# Patient Record
Sex: Female | Born: 1940 | Race: Black or African American | Hispanic: No | Marital: Single | Smoking: Never smoker
Health system: Southern US, Community
[De-identification: ages and names within clinical notes are randomized; demographics above are authoritative.]

---

## 2014-04-10 ENCOUNTER — Encounter (HOSPITAL_COMMUNITY): Payer: Self-pay

## 2014-04-10 ENCOUNTER — Emergency Department (HOSPITAL_COMMUNITY): Payer: Medicare PPO

## 2014-04-10 ENCOUNTER — Inpatient Hospital Stay (HOSPITAL_COMMUNITY)
Admission: EM | Admit: 2014-04-10 | Discharge: 2014-04-20 | DRG: 064 | Disposition: E | Payer: Medicare PPO | Attending: Internal Medicine | Admitting: Internal Medicine

## 2014-04-10 DIAGNOSIS — Z7982 Long term (current) use of aspirin: Secondary | ICD-10-CM

## 2014-04-10 DIAGNOSIS — Z66 Do not resuscitate: Secondary | ICD-10-CM

## 2014-04-10 DIAGNOSIS — R4189 Other symptoms and signs involving cognitive functions and awareness: Secondary | ICD-10-CM

## 2014-04-10 DIAGNOSIS — I629 Nontraumatic intracranial hemorrhage, unspecified: Secondary | ICD-10-CM

## 2014-04-10 DIAGNOSIS — G911 Obstructive hydrocephalus: Secondary | ICD-10-CM | POA: Diagnosis present

## 2014-04-10 DIAGNOSIS — W19XXXA Unspecified fall, initial encounter: Secondary | ICD-10-CM

## 2014-04-10 DIAGNOSIS — I619 Nontraumatic intracerebral hemorrhage, unspecified: Secondary | ICD-10-CM | POA: Diagnosis present

## 2014-04-10 DIAGNOSIS — R4020 Unspecified coma: Secondary | ICD-10-CM | POA: Diagnosis present

## 2014-04-10 DIAGNOSIS — G935 Compression of brain: Secondary | ICD-10-CM | POA: Diagnosis present

## 2014-04-10 DIAGNOSIS — R40243 Glasgow coma scale score 3-8: Secondary | ICD-10-CM | POA: Diagnosis present

## 2014-04-10 DIAGNOSIS — J9602 Acute respiratory failure with hypercapnia: Secondary | ICD-10-CM

## 2014-04-10 DIAGNOSIS — J9600 Acute respiratory failure, unspecified whether with hypoxia or hypercapnia: Secondary | ICD-10-CM

## 2014-04-10 DIAGNOSIS — R4182 Altered mental status, unspecified: Secondary | ICD-10-CM | POA: Diagnosis present

## 2014-04-10 DIAGNOSIS — I612 Nontraumatic intracerebral hemorrhage in hemisphere, unspecified: Secondary | ICD-10-CM

## 2014-04-10 DIAGNOSIS — Z515 Encounter for palliative care: Secondary | ICD-10-CM

## 2014-04-10 DIAGNOSIS — J9601 Acute respiratory failure with hypoxia: Secondary | ICD-10-CM | POA: Diagnosis present

## 2014-04-10 DIAGNOSIS — I959 Hypotension, unspecified: Secondary | ICD-10-CM | POA: Diagnosis present

## 2014-04-10 DIAGNOSIS — Z01818 Encounter for other preprocedural examination: Secondary | ICD-10-CM

## 2014-04-10 LAB — I-STAT CHEM 8, ED
BUN: 22 mg/dL (ref 6–23)
CALCIUM ION: 1.1 mmol/L — AB (ref 1.13–1.30)
CHLORIDE: 100 mmol/L (ref 96–112)
CREATININE: 1 mg/dL (ref 0.50–1.10)
Glucose, Bld: 174 mg/dL — ABNORMAL HIGH (ref 70–99)
HEMATOCRIT: 41 % (ref 36.0–46.0)
Hemoglobin: 13.9 g/dL (ref 12.0–15.0)
POTASSIUM: 3.1 mmol/L — AB (ref 3.5–5.1)
Sodium: 140 mmol/L (ref 135–145)
TCO2: 25 mmol/L (ref 0–100)

## 2014-04-10 LAB — CBC WITH DIFFERENTIAL/PLATELET
BASOS ABS: 0.1 10*3/uL (ref 0.0–0.1)
Basophils Relative: 0 % (ref 0–1)
EOS ABS: 0.4 10*3/uL (ref 0.0–0.7)
Eosinophils Relative: 3 % (ref 0–5)
HEMATOCRIT: 37.2 % (ref 36.0–46.0)
HEMOGLOBIN: 12.4 g/dL (ref 12.0–15.0)
LYMPHS ABS: 3.6 10*3/uL (ref 0.7–4.0)
LYMPHS PCT: 25 % (ref 12–46)
MCH: 32.1 pg (ref 26.0–34.0)
MCHC: 33.3 g/dL (ref 30.0–36.0)
MCV: 96.4 fL (ref 78.0–100.0)
MONO ABS: 0.7 10*3/uL (ref 0.1–1.0)
MONOS PCT: 5 % (ref 3–12)
Neutro Abs: 9.9 10*3/uL — ABNORMAL HIGH (ref 1.7–7.7)
Neutrophils Relative %: 67 % (ref 43–77)
Platelets: 249 10*3/uL (ref 150–400)
RBC: 3.86 MIL/uL — ABNORMAL LOW (ref 3.87–5.11)
RDW: 13.3 % (ref 11.5–15.5)
WBC: 14.6 10*3/uL — AB (ref 4.0–10.5)

## 2014-04-10 LAB — COMPREHENSIVE METABOLIC PANEL
ALT: 15 U/L (ref 0–35)
AST: 23 U/L (ref 0–37)
Albumin: 3.9 g/dL (ref 3.5–5.2)
Alkaline Phosphatase: 72 U/L (ref 39–117)
Anion gap: 11 (ref 5–15)
BILIRUBIN TOTAL: 0.3 mg/dL (ref 0.3–1.2)
BUN: 19 mg/dL (ref 6–23)
CHLORIDE: 100 mmol/L (ref 96–112)
CO2: 27 mmol/L (ref 19–32)
Calcium: 8.9 mg/dL (ref 8.4–10.5)
Creatinine, Ser: 1.09 mg/dL (ref 0.50–1.10)
GFR calc non Af Amer: 49 mL/min — ABNORMAL LOW (ref >90)
GFR, EST AFRICAN AMERICAN: 57 mL/min — AB (ref >90)
Glucose, Bld: 170 mg/dL — ABNORMAL HIGH (ref 70–99)
Potassium: 3.1 mmol/L — ABNORMAL LOW (ref 3.5–5.1)
Sodium: 138 mmol/L (ref 135–145)
Total Protein: 7.6 g/dL (ref 6.0–8.3)

## 2014-04-10 LAB — I-STAT VENOUS BLOOD GAS, ED
Acid-base deficit: 1 mmol/L (ref 0.0–2.0)
Bicarbonate: 25 mEq/L — ABNORMAL HIGH (ref 20.0–24.0)
O2 Saturation: 99 %
TCO2: 26 mmol/L (ref 0–100)
pCO2, Ven: 46.5 mmHg (ref 45.0–50.0)
pH, Ven: 7.338 — ABNORMAL HIGH (ref 7.250–7.300)
pO2, Ven: 146 mmHg — ABNORMAL HIGH (ref 30.0–45.0)

## 2014-04-10 LAB — RAPID URINE DRUG SCREEN, HOSP PERFORMED
Amphetamines: NOT DETECTED
Barbiturates: NOT DETECTED
Benzodiazepines: NOT DETECTED
Cocaine: NOT DETECTED
OPIATES: POSITIVE — AB
Tetrahydrocannabinol: NOT DETECTED

## 2014-04-10 LAB — URINALYSIS, ROUTINE W REFLEX MICROSCOPIC
Bilirubin Urine: NEGATIVE
GLUCOSE, UA: 100 mg/dL — AB
Hgb urine dipstick: NEGATIVE
KETONES UR: NEGATIVE mg/dL
LEUKOCYTES UA: NEGATIVE
Nitrite: NEGATIVE
Protein, ur: NEGATIVE mg/dL
Specific Gravity, Urine: 1.015 (ref 1.005–1.030)
Urobilinogen, UA: 0.2 mg/dL (ref 0.0–1.0)
pH: 6 (ref 5.0–8.0)

## 2014-04-10 LAB — AMMONIA: Ammonia: 27 umol/L (ref 11–32)

## 2014-04-10 LAB — I-STAT CG4 LACTIC ACID, ED: LACTIC ACID, VENOUS: 2.04 mmol/L — AB (ref 0.5–2.0)

## 2014-04-10 MED ORDER — SODIUM CHLORIDE 0.9 % IV SOLN
INTRAVENOUS | Status: AC | PRN
  Administered 2014-04-10: 1000 mL via INTRAVENOUS

## 2014-04-10 MED ORDER — SUCCINYLCHOLINE CHLORIDE 20 MG/ML IJ SOLN
INTRAMUSCULAR | Status: AC | PRN
Start: 2014-04-10 — End: 2014-04-10
  Administered 2014-04-10: 120 mg via INTRAVENOUS

## 2014-04-10 MED ORDER — NICARDIPINE HCL IN NACL 20-0.86 MG/200ML-% IV SOLN
3.0000 mg/h | Freq: Once | INTRAVENOUS | Status: DC
  Filled 2014-04-10: qty 200

## 2014-04-10 MED ORDER — PROPOFOL 10 MG/ML IV EMUL
INTRAVENOUS | Status: DC | PRN
  Administered 2014-04-10: 5 ug/kg/min via INTRAVENOUS

## 2014-04-10 MED ORDER — PROPOFOL 10 MG/ML IV EMUL: 5.0000 ug/kg/min | Freq: Once | INTRAVENOUS | Status: AC

## 2014-04-10 MED ORDER — ETOMIDATE 2 MG/ML IV SOLN
INTRAVENOUS | Status: AC | PRN
  Administered 2014-04-10: 20 mg via INTRAVENOUS

## 2014-04-10 MED ORDER — LIDOCAINE HCL (CARDIAC) 20 MG/ML IV SOLN
INTRAVENOUS | Status: AC | PRN
  Administered 2014-04-10: 100 mg via INTRAVENOUS

## 2014-04-10 MED ORDER — CETYLPYRIDINIUM CHLORIDE 0.05 % MT LIQD
7.0000 mL | Freq: Four times a day (QID) | OROMUCOSAL | Status: DC
  Administered 2014-04-10 – 2014-04-12 (×6): 7 mL via OROMUCOSAL

## 2014-04-10 MED ORDER — CHLORHEXIDINE GLUCONATE 0.12 % MT SOLN
15.0000 mL | Freq: Two times a day (BID) | OROMUCOSAL | Status: DC
  Administered 2014-04-11 (×3): 15 mL via OROMUCOSAL
  Filled 2014-04-10 (×3): qty 15

## 2014-04-10 MED ORDER — SODIUM CHLORIDE 0.9 % IV SOLN
INTRAVENOUS | Status: DC
  Administered 2014-04-10: 22:00:00 via INTRAVENOUS

## 2014-04-10 MED ORDER — SODIUM CHLORIDE 0.9 % IV SOLN: 250.0000 mL | INTRAVENOUS | Status: DC | PRN

## 2014-04-10 MED ORDER — PHENYLEPHRINE HCL 10 MG/ML IJ SOLN
30.0000 ug/min | INTRAVENOUS | Status: DC
  Administered 2014-04-10: 50 ug/min via INTRAVENOUS
  Filled 2014-04-10: qty 1

## 2014-04-10 MED ORDER — SODIUM CHLORIDE 0.9 % IV BOLUS (SEPSIS)
500.0000 mL | Freq: Once | INTRAVENOUS | Status: AC
  Administered 2014-04-10: 500 mL via INTRAVENOUS

## 2014-04-10 NOTE — ED Notes (Signed)
Please page Dr. Franky Machoabbell at [phone] when the pt's other daughter arrives.

## 2014-04-10 NOTE — ED Notes (Signed)
DR. Romeo AppleHarrison at bedside to speak with family about DNR orders.

## 2014-04-10 NOTE — ED Provider Notes (Signed)
5:55 PM Accepted care from Dr. Craige CottaSchmitt. Dr. Mikal Planeabell has seen the pt as well and agrees it is a non-survivable head bleed. Currently awaiting more family to get here to discuss possible extubation.   The family would like the patient admitted until further family members get here. I discussed the case with critical care who will admit her. I filled out DO NOT RESUSCITATE paperwork with the family's permission.  Clinical Impression 1. Intracranial hemorrhage   2. Unresponsive   3. Encounter for intubation   4. Shirley Buchanan      Unknown Flannigan, MD 04/14/2014 [id number]

## 2014-04-10 NOTE — ED Notes (Signed)
Pt found unresponsive between toilet and shower last seen at 1300 today, pt did not respond to painful stimuli, after IN narcan and IV narcan only change was increase in respirations. Pt cbg with ems 142 hypertensive 200/170

## 2014-04-10 NOTE — ED Provider Notes (Signed)
CSN: [id number]     Arrival date & time 03/27/2014  1340 History   First MD Initiated Contact with Patient 03/24/2014 1352     Chief Complaint  Patient presents with  . Altered Mental Status     (Consider location/radiation/quality/duration/timing/severity/associated sxs/prior Treatment) HPI Comments: Level 5 exception for acuity.   Patient is a 74 y.o. female presenting with altered mental status. The history is provided by the EMS personnel. The history is limited by the condition of the patient. No language interpreter was used.  Altered Mental Status Presenting symptoms: unresponsiveness   Severity:  Severe Most recent episode:  Today Context: not head injury     History reviewed. No pertinent past medical history. History reviewed. No pertinent past surgical history. History reviewed. No pertinent family history. History  Substance Use Topics  . Smoking status: Never Smoker   . Smokeless tobacco: Not on file  . Alcohol Use: No   OB History    No data available     Review of Systems  Unable to perform ROS: Patient unresponsive      Allergies  Review of patient's allergies indicates no known allergies.  Home Medications   Prior to Admission medications   Medication Sig Start Date End Date Taking? Authorizing Provider  aspirin 81 MG tablet Take 81 mg by mouth daily.   Yes Historical Provider, MD  benzonatate (TESSALON) 100 MG capsule Take 100 mg by mouth 3 (three) times daily as needed for cough.   Yes Historical Provider, MD  dextromethorphan-guaiFENesin (MUCINEX DM) 30-600 MG per 12 hr tablet Take 1 tablet by mouth 2 (two) times daily as needed for cough.   Yes Historical Provider, MD  Diclofenac Sodium 2 % SOLN Place onto the skin.   Yes Historical Provider, MD  loratadine (CLARITIN) 10 MG tablet Take 10 mg by mouth daily.   Yes Historical Provider, MD  Naproxen-Esomeprazole 500-20 MG TBEC Take by mouth.   Yes Historical Provider, MD  omeprazole (PRILOSEC) 20 MG  capsule Take 20 mg by mouth 2 (two) times daily.   Yes Historical Provider, MD  promethazine-codeine (PHENERGAN WITH CODEINE) 6.25-10 MG/5ML syrup Take 5 mLs by mouth every 6 (six) hours as needed for cough.   Yes Historical Provider, MD  triamterene-hydrochlorothiazide (DYAZIDE) 37.5-25 MG per capsule Take 1 capsule by mouth daily.   Yes Historical Provider, MD   BP 101/49 mmHg  Pulse 57  Temp(Src) 93.9 F (34.4 C) (Rectal)  Resp 16  Wt 200 lb (90.719 kg)  SpO2 100% Physical Exam  Constitutional: She appears well-developed and well-nourished.  HENT:  Head: Normocephalic and atraumatic.  Eyes: Right pupil is not reactive. Right pupil is round. Left pupil is not reactive. Left pupil is round. Pupils are equal.  Pinpoint pupils  Cardiovascular: Normal rate, regular rhythm, normal heart sounds and intact distal pulses.   Pulmonary/Chest: Effort normal. No respiratory distress. She has no wheezes. She exhibits no tenderness.  Abdominal: Soft. Bowel sounds are normal. She exhibits no distension. There is no tenderness. There is no rebound and no guarding.  Neurological: She is unresponsive. GCS eye subscore is 1. GCS verbal subscore is 1. GCS motor subscore is 1.  No spontaneous movement and no response to pain.    Skin: Skin is warm and dry.  Nursing note and vitals reviewed.   ED Course  INTUBATION Date/Time: 03/27/2014 2:00 PM Performed by: Bethann Berkshire Authorized by: Bethann Berkshire Consent: The procedure was performed in an emergent situation. Indications: airway protection Intubation  method: direct Patient status: paralyzed (RSI) Preoxygenation: BVM Sedatives: etomidate Paralytic: succinylcholine Laryngoscope size: Miller 3 Tube size: 7.5 mm Tube type: cuffed Number of attempts: 1 Cricoid pressure: yes Cords visualized: yes Post-procedure assessment: chest rise and CO2 detector Breath sounds: equal Cuff inflated: yes ETT to lip: 21 cm Tube secured with: ETT  holder Chest x-ray interpreted by me. Chest x-ray findings: endotracheal tube in appropriate position Patient tolerance: Patient tolerated the procedure well with no immediate complications   (including critical care time) Labs Review Labs Reviewed  CBC WITH DIFFERENTIAL - Abnormal; Notable for the following:    WBC 14.6 (*)    RBC 3.86 (*)    Neutro Abs 9.9 (*)    All other components within normal limits  URINE RAPID DRUG SCREEN (HOSP PERFORMED) - Abnormal; Notable for the following:    Opiates POSITIVE (*)    All other components within normal limits  URINALYSIS, ROUTINE W REFLEX MICROSCOPIC - Abnormal; Notable for the following:    Glucose, UA 100 (*)    All other components within normal limits  COMPREHENSIVE METABOLIC PANEL - Abnormal; Notable for the following:    Potassium 3.1 (*)    Glucose, Bld 170 (*)    GFR calc non Af Amer 49 (*)    GFR calc Af Amer 57 (*)    All other components within normal limits  I-STAT CHEM 8, ED - Abnormal; Notable for the following:    Potassium 3.1 (*)    Glucose, Bld 174 (*)    Calcium, Ion 1.10 (*)    All other components within normal limits  I-STAT CG4 LACTIC ACID, ED - Abnormal; Notable for the following:    Lactic Acid, Venous 2.04 (*)    All other components within normal limits  I-STAT VENOUS BLOOD GAS, ED - Abnormal; Notable for the following:    pH, Ven 7.338 (*)    pO2, Ven 146.0 (*)    Bicarbonate 25.0 (*)    All other components within normal limits  MRSA PCR SCREENING  AMMONIA    Imaging Review Ct Head Wo Contrast  03/25/2014   CLINICAL DATA:  Found down.  EXAM: CT HEAD WITHOUT CONTRAST  CT CERVICAL SPINE WITHOUT CONTRAST  TECHNIQUE: Multidetector CT imaging of the head and cervical spine was performed following the standard protocol without intravenous contrast. Multiplanar CT image reconstructions of the cervical spine were also generated.  COMPARISON:  None.  FINDINGS: CT HEAD FINDINGS  There is a large amount of  intraventricular hemorrhage which appears primarily centered at the level of the fourth ventricle which appears markedly expanded (image 11, series 2). There is upstream hydrocephalus with blood seen within the third and bilateral lateral ventricles. Subarachnoid blood is seen about the imaged superior aspect of the cervical spine (image 4, series 2).  No definite evidence of subdural hemorrhage. No definite large territory infarct. No definite intraparenchymal or extra-axial mass. No midline shift. Intracranial atherosclerosis.  Mucosal thickening within the bilateral posterior ethmoidal air cells. Small air-fluid level within the right maxillary sinus. Remaining paranasal sinuses mastoid air cells are normally aerated.  Regional soft tissues appear normal. No displaced calvarial fracture.  CT CERVICAL SPINE FINDINGS  C1 to the inferior endplate of T2 is imaged.  There is mild straightening and slight reversal of the expected cervical lordosis. No anterolisthesis or retrolisthesis. The bilateral facets are normally aligned. The dens is normally positioned between the lateral masses of C1.  No fracture or static subluxation of the cervical  spine. Cervical vertebral body heights and prevertebral soft tissues are normal.  The patient is intubated.  An orogastric tube is also identified.  Moderate to severe multilevel cervical spine DDD, worse at C5-C6 and to a lesser extent, C3-C4, C4-C5 and C6-C7 with disc space height loss, endplate irregularity and sclerosis. A bone island is incidentally noted within the right C7 pedicle.  No bulky cervical lymphadenopathy on this noncontrast examination.  Limited visualization of lung apices demonstrates biapical pleural parenchymal thickening and suspected small bilateral effusions, left greater than right.  IMPRESSION: 1. Large amount of intra ventricular blood which appears centered within the fourth ventricle, the etiology of which is not depicted on this examination. 2.  Intraventricular blood is seen extending through the third ventricle to the bilateral lateral ventricles where mild to moderate upstream hydrocephalus is identified. 3. A small amount of subarachnoid blood surrounds the image superior aspect of the cervical spine. 4. No fracture or static subluxation of the cervical spine. 5. Moderate to severe multilevel cervical spine DDD, worse at C5-C6. Critical Value/emergent results were called by telephone at the time of interpretation on 2014-04-16 at 2:43 pm to Dr. Arby Barrette , who verbally acknowledged these results.   Electronically Signed   By: Simonne Come M.D.   On: 04/16/2014 14:42   Ct Cervical Spine Wo Contrast  April 16, 2014   CLINICAL DATA:  Found down.  EXAM: CT HEAD WITHOUT CONTRAST  CT CERVICAL SPINE WITHOUT CONTRAST  TECHNIQUE: Multidetector CT imaging of the head and cervical spine was performed following the standard protocol without intravenous contrast. Multiplanar CT image reconstructions of the cervical spine were also generated.  COMPARISON:  None.  FINDINGS: CT HEAD FINDINGS  There is a large amount of intraventricular hemorrhage which appears primarily centered at the level of the fourth ventricle which appears markedly expanded (image 11, series 2). There is upstream hydrocephalus with blood seen within the third and bilateral lateral ventricles. Subarachnoid blood is seen about the imaged superior aspect of the cervical spine (image 4, series 2).  No definite evidence of subdural hemorrhage. No definite large territory infarct. No definite intraparenchymal or extra-axial mass. No midline shift. Intracranial atherosclerosis.  Mucosal thickening within the bilateral posterior ethmoidal air cells. Small air-fluid level within the right maxillary sinus. Remaining paranasal sinuses mastoid air cells are normally aerated.  Regional soft tissues appear normal. No displaced calvarial fracture.  CT CERVICAL SPINE FINDINGS  C1 to the inferior endplate of T2  is imaged.  There is mild straightening and slight reversal of the expected cervical lordosis. No anterolisthesis or retrolisthesis. The bilateral facets are normally aligned. The dens is normally positioned between the lateral masses of C1.  No fracture or static subluxation of the cervical spine. Cervical vertebral body heights and prevertebral soft tissues are normal.  The patient is intubated.  An orogastric tube is also identified.  Moderate to severe multilevel cervical spine DDD, worse at C5-C6 and to a lesser extent, C3-C4, C4-C5 and C6-C7 with disc space height loss, endplate irregularity and sclerosis. A bone island is incidentally noted within the right C7 pedicle.  No bulky cervical lymphadenopathy on this noncontrast examination.  Limited visualization of lung apices demonstrates biapical pleural parenchymal thickening and suspected small bilateral effusions, left greater than right.  IMPRESSION: 1. Large amount of intra ventricular blood which appears centered within the fourth ventricle, the etiology of which is not depicted on this examination. 2. Intraventricular blood is seen extending through the third ventricle to the bilateral lateral  ventricles where mild to moderate upstream hydrocephalus is identified. 3. A small amount of subarachnoid blood surrounds the image superior aspect of the cervical spine. 4. No fracture or static subluxation of the cervical spine. 5. Moderate to severe multilevel cervical spine DDD, worse at C5-C6. Critical Value/emergent results were called by telephone at the time of interpretation on 03/21/2014 at 2:43 pm to Dr. Arby Barrette , who verbally acknowledged these results.   Electronically Signed   By: Simonne Come M.D.   On: 04/07/2014 14:42   Dg Chest Portable 1 View  04/07/2014   CLINICAL DATA:  Intubation, trauma  EXAM: PORTABLE CHEST - 1 VIEW  COMPARISON:  None.  FINDINGS: Endotracheal tube 2.4 cm above the carina. NG tube enters the stomach with the tip not  visualized. Mild cardiomegaly with central vascular congestion. Minor bibasilar and streaky left midlung atelectasis. No focal pneumonia, collapse or consolidation. No effusion or pneumothorax. Atherosclerosis of the aorta. Degenerative changes of the shoulders.  IMPRESSION: Cardiomegaly with vascular congestion  Bilateral atelectasis.  Support apparatus as above.   Electronically Signed   By: Ruel Favors M.D.   On: 04/01/2014 14:20     EKG Interpretation   Date/Time:  Friday April 10 2014 13:45:26 EST Ventricular Rate:  69 PR Interval:  54 QRS Duration: 99 QT Interval:  417 QTC Calculation: 447 R Axis:   51 Text Interpretation:  Age not entered, assumed to be  74 years old for  purpose of ECG interpretation Sinus rhythm Short PR interval Minimal ST  depression, inferior leads AGREE. NO stemi. Confirmed by Donnald Garre, MD,  Lebron Conners [phone]) on 04/16/2014 4:21:31 PM      MDM   Final diagnoses:  Intracranial hemorrhage    74 y/o F with PMH of HTN here with AMS.  Patient found in the bathroom unresponsive by her daughter.  She was found by EMS to have agonal respirations.  She was bagged en route to the emergency department.  Upon arrival she is hypertensive with pinpoint pupils.  She was treated with narcan by EMS with no change in her mental status.  Patient was felt to require intubation for airway protection and was intubated as detailed above upon arrival.  Initial differential is concerning for intracranial hemorrhage, overdose, traumatic injury, sepsis, and metabolic encephalopathy.  Initial work-up included CT head w/o contrast, CMP, ammonia, CBC, lactic acid, UA, UDS, and VBG.  CT of the head demonstrated a large brainstem head bleed.  Neurosurgery was consulted.  They evaluated the patient's imaging and felt that the patient's head bleed was non-survivable.  Prior to transfer to the CT scanner patient was started on propofol for sedation.  After the CT scan she became hypotensive with a  BP of 60/39.  As a result the propofol was held and she was treated with  2 liters of NS.  Patient did not have any improvement in her blood pressure.  The patient had no change in her mental status after holding the propofol.  She did not have any spontaneous movements.  Patient's prognosis was discussed with her daughter.  The daughter wished to discuss the patient's prognosis with the remainder of her family.  The patient's care was transferred to Dr. Essie Christine in a good condition.  We were awaiting the families decision about further care of the patient.  Labs and imaging were reviewed by myself and considered in medical decision making.  Imaging was interpreted by Radiology.  Care was discussed with my attending Dr. Donnald Garre.  Bethann BerkshireAaron Niurka Benecke, MD 04/11/14 [id number]  Arby BarretteMarcy Pfeiffer, MD 04/11/14 [phone]

## 2014-04-10 NOTE — ED Notes (Signed)
Dr. Franky Machoabbell paged to notify of family arrival

## 2014-04-10 NOTE — ED Notes (Signed)
X ray done

## 2014-04-10 NOTE — ED Notes (Signed)
[facility] called. Spoke with Barbette Orionne Garner. Reference number [id number] [id number].

## 2014-04-10 NOTE — ED Notes (Signed)
Patient transported to CT 

## 2014-04-10 NOTE — ED Notes (Signed)
MD at bedside. 

## 2014-04-10 NOTE — Progress Notes (Signed)
Call from Dr Romeo AppleHarrison in ER  - massive IC Hge - non survivable - no neurosur intervention possible  Plan - admit by PCCM to [facility] - eMD will do admit orders and then do camera care upon arrival to 56M - DNAR to be initiated by ER doc  - family to gather per ER doc so terminal wean can be initiated    Dr. Kalman ShanMurali Tanveer Dobberstein, M.D., [facility] Pulmonary and Critical Care Medicine Staff Physician [facility] [place] [facility] Pager: [phone], If no answer or between  15:00h - 7:00h: call [phone]  04/11/2014 9:26 PM

## 2014-04-10 NOTE — Progress Notes (Signed)
Camera communication with a daughter and son at bedside  - explaiend terminal prognosis and gcs 3 - currently hypotensive with HR 63 and herniating likely  She wants patuient "kept alive" till [facility] [facility] [place] kept alive. ETA of this brother is 04/11/14 > 4pm.   PLAN Start neo Explained herniation and prognosis -she understand and hoping for a miracle patient alive till brother gets in  Dr. Kalman ShanMurali Willie Plain, M.D., [facility] Pulmonary and Critical Care Medicine Staff Physician [facility] [place] [facility] and Critical Care Pager: [phone], If no answer or between  15:00h - 7:00h: call [phone]  Jul 09, 2014 11:16 PM

## 2014-04-10 NOTE — ED Notes (Signed)
Returned from ct, MD aware of results and blood pressure.

## 2014-04-10 NOTE — ED Notes (Signed)
Pt first name has been changed in the system after blood tubes have been labeled. MRN still the same. Called lab to verify this labeling was okay, which it is per lab tech.

## 2014-04-10 NOTE — Progress Notes (Signed)
Chaplain visited with Ms. Shirley Buchanan, her daughter and family friend bedside.   Pt's daughter is wrestling with what's happening to her mother. She has made numerous calls to family members locally and in the surrounding area to come support and be with her.   Daughter contacted pastor as well. Friend and daughter are bedside. Daughter has begun to anticipate grief and is visibly sorrowful.   [name], [name], Chaplain 04/01/2014

## 2014-04-10 NOTE — H&P (Addendum)
PULMONARY / CRITICAL CARE MEDICINE   Name: Shirley Buchanan MRN: [id number] DOB: 11-22-1940    ADMISSION DATE:  03/22/2014 CONSULTATION DATE: 04/07/2014   REFERRING MD :  Dr Romeo AppleHarrison of ER  CHIEF COMPLAINT:  Massive Intracerebral Hemorrhage   HISTORY OF PRESENT ILLNESS:  Hx from ER notes and Dr Romeo Appleharrison. Patient comatose and uanble to provide h x;   74 year old female found unresponisvve in shower 04/16/2014. Last seen normal at 13.00h today. EMS arrival BP 200/170. CT showed massive intracranial hemorrhage - non survivable. Seen by Dr Eulogio Bearabbeell in ER. Family agreed to DNAR in ER and iwshes to proceed with terminal wean after son from [place] arrives 04/11/14 > 4pm. PCCM called to admit. On arrival to 11M ICU - hypotensive MAP 39 with HR 60 and GCS 3 without sedation. Family requesting patient be kept alive till son arrives 04/11/14 to extent possible   PAST MEDICAL HISTORY :   has no past medical history on file.  has no past surgical history on file. Prior to Admission medications   Medication Sig Start Date End Date Taking? Authorizing Provider  aspirin 81 MG tablet Take 81 mg by mouth daily.   Yes Historical Provider, MD  benzonatate (TESSALON) 100 MG capsule Take 100 mg by mouth 3 (three) times daily as needed for cough.   Yes Historical Provider, MD  dextromethorphan-guaiFENesin (MUCINEX DM) 30-600 MG per 12 hr tablet Take 1 tablet by mouth 2 (two) times daily as needed for cough.   Yes Historical Provider, MD  Diclofenac Sodium 2 % SOLN Place onto the skin.   Yes Historical Provider, MD  loratadine (CLARITIN) 10 MG tablet Take 10 mg by mouth daily.   Yes Historical Provider, MD  Naproxen-Esomeprazole 500-20 MG TBEC Take by mouth.   Yes Historical Provider, MD  omeprazole (PRILOSEC) 20 MG capsule Take 20 mg by mouth 2 (two) times daily.   Yes Historical Provider, MD  promethazine-codeine (PHENERGAN WITH CODEINE) 6.25-10 MG/5ML syrup Take 5 mLs by mouth every 6 (six) hours as needed for  cough.   Yes Historical Provider, MD  triamterene-hydrochlorothiazide (DYAZIDE) 37.5-25 MG per capsule Take 1 capsule by mouth daily.   Yes Historical Provider, MD   No Known Allergies  FAMILY HISTORY:  has no family status information on file.  SOCIAL HISTORY:  reports that she does not drink alcohol or use illicit drugs.  REVIEW OF SYSTEMS:  unelicitable  SUBJECTIVE:   VITAL SIGNS: Temp:  [96.9 F (36.1 C)] 96.9 F (36.1 C) (01/22 1407) Pulse Rate:  [57-79] 61 (01/22 2200) Resp:  [12-21] 13 (01/22 2200) BP: (55-210)/(27-123) 97/60 mmHg (01/22 2200) SpO2:  [86 %-100 %] 100 % (01/22 2200) FiO2 (%):  [60 %] 60 % (01/22 1929) Weight:  [200 lb (90.719 kg)] 200 lb (90.719 kg) (01/22 1406) HEMODYNAMICS:   VENTILATOR SETTINGS: Vent Mode:  [-] PRVC FiO2 (%):  [60 %] 60 % Set Rate:  [14 bmp] 14 bmp Vt Set:  [500 mL] 500 mL PEEP:  [5 cmH20] 5 cmH20 Plateau Pressure:  [21 cmH20] 21 cmH20 INTAKE / OUTPUT:  Intake/Output Summary (Last 24 hours) at 03/24/2014 2317 Last data filed at 04/04/2014 2115  Gross per 24 hour  Intake   1000 ml  Output    770 ml  Net    230 ml    PHYSICAL EXAMINATION: Gen: on vent HEENT: NCAT ETT PULM: CTA CV: RRR, no mgr AB: BS+, soft, nontender Ext: warm Neuro: GCS 4 (flicker extension toes to pain)  LABS:  CBC  Recent Labs Lab April 30, 2014 1353 30-Apr-2014 1419  WBC 14.6*  --   HGB 12.4 13.9  HCT 37.2 41.0  PLT 249  --    Coag's No results for input(s): APTT, INR in the last 168 hours. BMET  Recent Labs Lab 04-30-14 1353 04/30/14 1419  NA 138 140  K 3.1* 3.1*  CL 100 100  CO2 27  --   BUN 19 22  CREATININE 1.09 1.00  GLUCOSE 170* 174*   Electrolytes  Recent Labs Lab Apr 30, 2014 1353  CALCIUM 8.9   Sepsis Markers  Recent Labs Lab 04-30-14 1419  LATICACIDVEN 2.04*   ABG No results for input(s): PHART, PCO2ART, PO2ART in the last 168 hours. Liver Enzymes  Recent Labs Lab 04-30-14 1353  AST 23  ALT 15  ALKPHOS 72   BILITOT 0.3  ALBUMIN 3.9   Cardiac Enzymes No results for input(s): TROPONINI, PROBNP in the last 168 hours. Glucose No results for input(s): GLUCAP in the last 168 hours.  Imaging Ct Head Wo Contrast  04-30-2014   CLINICAL DATA:  Found down.  EXAM: CT HEAD WITHOUT CONTRAST  CT CERVICAL SPINE WITHOUT CONTRAST  TECHNIQUE: Multidetector CT imaging of the head and cervical spine was performed following the standard protocol without intravenous contrast. Multiplanar CT image reconstructions of the cervical spine were also generated.  COMPARISON:  None.  FINDINGS: CT HEAD FINDINGS  There is a large amount of intraventricular hemorrhage which appears primarily centered at the level of the fourth ventricle which appears markedly expanded (image 11, series 2). There is upstream hydrocephalus with blood seen within the third and bilateral lateral ventricles. Subarachnoid blood is seen about the imaged superior aspect of the cervical spine (image 4, series 2).  No definite evidence of subdural hemorrhage. No definite large territory infarct. No definite intraparenchymal or extra-axial mass. No midline shift. Intracranial atherosclerosis.  Mucosal thickening within the bilateral posterior ethmoidal air cells. Small air-fluid level within the right maxillary sinus. Remaining paranasal sinuses mastoid air cells are normally aerated.  Regional soft tissues appear normal. No displaced calvarial fracture.  CT CERVICAL SPINE FINDINGS  C1 to the inferior endplate of T2 is imaged.  There is mild straightening and slight reversal of the expected cervical lordosis. No anterolisthesis or retrolisthesis. The bilateral facets are normally aligned. The dens is normally positioned between the lateral masses of C1.  No fracture or static subluxation of the cervical spine. Cervical vertebral body heights and prevertebral soft tissues are normal.  The patient is intubated.  An orogastric tube is also identified.  Moderate to severe  multilevel cervical spine DDD, worse at C5-C6 and to a lesser extent, C3-C4, C4-C5 and C6-C7 with disc space height loss, endplate irregularity and sclerosis. A bone island is incidentally noted within the right C7 pedicle.  No bulky cervical lymphadenopathy on this noncontrast examination.  Limited visualization of lung apices demonstrates biapical pleural parenchymal thickening and suspected small bilateral effusions, left greater than right.  IMPRESSION: 1. Large amount of intra ventricular blood which appears centered within the fourth ventricle, the etiology of which is not depicted on this examination. 2. Intraventricular blood is seen extending through the third ventricle to the bilateral lateral ventricles where mild to moderate upstream hydrocephalus is identified. 3. A small amount of subarachnoid blood surrounds the image superior aspect of the cervical spine. 4. No fracture or static subluxation of the cervical spine. 5. Moderate to severe multilevel cervical spine DDD, worse at C5-C6. Critical Value/emergent results  were called by telephone at the time of interpretation on 19-Apr-2014 at 2:43 pm to Dr. Arby Barrette , who verbally acknowledged these results.   Electronically Signed   By: Simonne Come M.D.   On: 04-19-14 14:42   Ct Cervical Spine Wo Contrast  April 19, 2014   CLINICAL DATA:  Found down.  EXAM: CT HEAD WITHOUT CONTRAST  CT CERVICAL SPINE WITHOUT CONTRAST  TECHNIQUE: Multidetector CT imaging of the head and cervical spine was performed following the standard protocol without intravenous contrast. Multiplanar CT image reconstructions of the cervical spine were also generated.  COMPARISON:  None.  FINDINGS: CT HEAD FINDINGS  There is a large amount of intraventricular hemorrhage which appears primarily centered at the level of the fourth ventricle which appears markedly expanded (image 11, series 2). There is upstream hydrocephalus with blood seen within the third and bilateral lateral  ventricles. Subarachnoid blood is seen about the imaged superior aspect of the cervical spine (image 4, series 2).  No definite evidence of subdural hemorrhage. No definite large territory infarct. No definite intraparenchymal or extra-axial mass. No midline shift. Intracranial atherosclerosis.  Mucosal thickening within the bilateral posterior ethmoidal air cells. Small air-fluid level within the right maxillary sinus. Remaining paranasal sinuses mastoid air cells are normally aerated.  Regional soft tissues appear normal. No displaced calvarial fracture.  CT CERVICAL SPINE FINDINGS  C1 to the inferior endplate of T2 is imaged.  There is mild straightening and slight reversal of the expected cervical lordosis. No anterolisthesis or retrolisthesis. The bilateral facets are normally aligned. The dens is normally positioned between the lateral masses of C1.  No fracture or static subluxation of the cervical spine. Cervical vertebral body heights and prevertebral soft tissues are normal.  The patient is intubated.  An orogastric tube is also identified.  Moderate to severe multilevel cervical spine DDD, worse at C5-C6 and to a lesser extent, C3-C4, C4-C5 and C6-C7 with disc space height loss, endplate irregularity and sclerosis. A bone island is incidentally noted within the right C7 pedicle.  No bulky cervical lymphadenopathy on this noncontrast examination.  Limited visualization of lung apices demonstrates biapical pleural parenchymal thickening and suspected small bilateral effusions, left greater than right.  IMPRESSION: 1. Large amount of intra ventricular blood which appears centered within the fourth ventricle, the etiology of which is not depicted on this examination. 2. Intraventricular blood is seen extending through the third ventricle to the bilateral lateral ventricles where mild to moderate upstream hydrocephalus is identified. 3. A small amount of subarachnoid blood surrounds the image superior aspect  of the cervical spine. 4. No fracture or static subluxation of the cervical spine. 5. Moderate to severe multilevel cervical spine DDD, worse at C5-C6. Critical Value/emergent results were called by telephone at the time of interpretation on 04/19/14 at 2:43 pm to Dr. Arby Barrette , who verbally acknowledged these results.   Electronically Signed   By: Simonne Come M.D.   On: 04/19/14 14:42   Dg Chest Portable 1 View  04/19/2014   CLINICAL DATA:  Intubation, trauma  EXAM: PORTABLE CHEST - 1 VIEW  COMPARISON:  None.  FINDINGS: Endotracheal tube 2.4 cm above the carina. NG tube enters the stomach with the tip not visualized. Mild cardiomegaly with central vascular congestion. Minor bibasilar and streaky left midlung atelectasis. No focal pneumonia, collapse or consolidation. No effusion or pneumothorax. Atherosclerosis of the aorta. Degenerative changes of the shoulders.  IMPRESSION: Cardiomegaly with vascular congestion  Bilateral atelectasis.  Support apparatus as above.  Electronically Signed   By: Ruel Favors M.D.   On: 04-18-14 14:20     ASSESSMENT / PLAN:  PULMONARY OETT Apr 18, 2014  A: Acute resp failure with hypoxemia due to massive IC hemorrhage P:   Full vent support Terminal wean 04/11/14 if patient survives till son arrives  CARDIOVASCULAR CVL none A: Circulatory shock likely due to herniation 04-18-14  P:  Fluid bolus Neo via PIV for MAP > 65   NEUROLOGIC A:  Massive IC hemorrhage with obstructive hydrocephalus. GCS 3. Non survivable. Non operable  P:   NO sedation Suppprotive care   FAMILY  - Updates: eMD Dr Marchelle Gearing updated daughter via video conferencing 11:27 PM 04-18-2014; Dr. Kendrick Fries updated at bedside 1/23 AM  - Inter-disciplinary family meet or Palliative Care meeting due by:  NA   35 CC time  Heber Elgin, MD [place] [facility] Pager: [phone] Cell: [phone] If no response, call [phone]

## 2014-04-10 NOTE — ED Notes (Signed)
Altamese Dillingrenessa Lowery (daughter) [phone] and Marvel PlanDavid Lowery [phone]

## 2014-04-10 NOTE — ED Notes (Signed)
Family arrived and sent to consultation room. MD informed. Chaplain paged.

## 2014-04-10 NOTE — ED Notes (Signed)
Daughter: [phone]

## 2014-04-10 NOTE — ED Notes (Signed)
Pfeiffer, MD notified of abnormal lab test results 

## 2014-04-11 LAB — MRSA PCR SCREENING: MRSA BY PCR: NEGATIVE

## 2014-04-11 MED ORDER — DEXTROSE 5 % IV SOLN
30.0000 ug/min | INTRAVENOUS | Status: DC
  Administered 2014-04-11: 300 ug/min via INTRAVENOUS
  Administered 2014-04-11: 60 ug/min via INTRAVENOUS
  Administered 2014-04-11 (×2): 300 ug/min via INTRAVENOUS
  Administered 2014-04-11: 190 ug/min via INTRAVENOUS
  Administered 2014-04-11: 250 ug/min via INTRAVENOUS
  Administered 2014-04-12 (×2): 300 ug/min via INTRAVENOUS
  Filled 2014-04-11 (×10): qty 4

## 2014-04-11 NOTE — Progress Notes (Signed)
Replaced tube holder at request of family

## 2014-04-11 NOTE — Progress Notes (Signed)
Nutrition Brief Note  Patient identified due to vent status.  Per chart, CT showed massive intracranial hemorrhage - non survivable. Seen by Dr Eulogio Bearabbeell in ER. Family agreed to DNAR in ER and wishes to proceed with terminal wean after son from [place] arrives 04/11/14 > 4pm.   No nutrition interventions warranted at this time. If nutrition issues arise, please consult RD.   Ian Malkineanne Barnett RD, LDN Inpatient Clinical Dietitian Pager: [phone] After Hours Pager: [phone]

## 2014-04-12 MED ORDER — MORPHINE BOLUS VIA INFUSION
5.0000 mg | INTRAVENOUS | Status: DC | PRN
  Filled 2014-04-12: qty 20

## 2014-04-12 MED ORDER — LORAZEPAM 2 MG/ML IJ SOLN
2.0000 mg | INTRAMUSCULAR | Status: DC | PRN
Start: 2014-04-12 — End: 2014-04-12

## 2014-04-12 MED ORDER — MORPHINE SULFATE 25 MG/ML IV SOLN
10.0000 mg/h | INTRAVENOUS | Status: DC
  Administered 2014-04-12: 10 mg/h via INTRAVENOUS
  Filled 2014-04-12: qty 10

## 2014-04-12 MED ORDER — LORAZEPAM BOLUS VIA INFUSION: 2.0000 mg | INTRAVENOUS | Status: DC | PRN

## 2014-04-18 NOTE — Discharge Summary (Signed)
Shirley Buchanan:  Shirley Buchanan, Shirley Buchanan              ACCOUNT NO.:  [id number]  MEDICAL RECORD NO.:  [id number]  LOCATION:  3M03C                        FACILITY:  [facility]  PHYSICIAN:  Veto KempsUGLAS BRENT MCQUAID, MDDATE OF BIRTH:  Jan 04, 1941  DATE OF CONSULTATION: DATE OF DISCHARGE:  04/06/2014                                CONSULTATION   DEATH NOTE.  DATE OF DEATH:  April 12, 2014.  CAUSE OF DEATH:  Massive intracerebral hemorrhage.  HISTORY OF PRESENT ILLNESS:  This is a 74 year old female, who was found unresponsive in her shower on April 10, 2014.  She had last been seen normal at 1 o'clock earlier that day.  [facility] by [facility] where a large non-survivable intracranial hemorrhage was found with a blood pressure of 200/100.  The patient was seen by Neurosurgery in the ER and the family agreed to a do not resuscitate order.  She was admitted to the intensive care unit. while awaiting arrival of her [place].  Pulmonary and Critical Care Medicine was called for admission.  HOSPITAL COURSE:  The patient was admitted to the intensive care unit and in the ICU, she received comfort measures.  She was treated with a low-dose of Neo-Synephrine to maintain her blood pressure while awaiting arrival of family.  After family arrived and discussions were held with the Pulmonary and Critical Care Service, the patient's family elected to withdraw care.  She died peacefully with her family at the bedside on April 12, 2014, early in the morning.          ______________________________ Veto KempsUGLAS BRENT MCQUAID, MD     DBM/MEDQ  D:  04/17/2014  T:  04/18/2014  Job:  [id number]

## 2014-04-20 NOTE — Progress Notes (Signed)
eLink Physician-Brief Progress Note Patient Name: Shirley SandersBessie Ducat DOB: Sep 14, 1940 MRN: [id number]   Date of Service  04/19/2014  HPI/Events of Note  Pt family now wants EOL care and withdrawal of care   eICU Interventions  orderset entered     Intervention Category Major Interventions: End of life / care limitation discussion  Shan Levansatrick Traven Davids 03/29/2014, 5:15 AM

## 2014-04-20 NOTE — Procedures (Signed)
Extubation Procedure Note  Patient Details:   Name: Shirley SandersBessie Hout DOB: 02-15-1941 MRN: [id number]   Airway Documentation:     Evaluation  O2 sats: transiently fell during during procedure Complications: No apparent complications Patient did tolerate procedure well. Bilateral Breath Sounds: Rhonchi Suctioning: Airway No   Pt. Was terminally extubated to a 4L [place] with RN at the bedside using the withdrawal of life support protocol.  Carlynn SpryCobb, Gentry Pilson L 2014/12/09, 5:37 AM

## 2014-04-20 NOTE — Progress Notes (Addendum)
Patient asystole at 0548. Pronouncement of death verified by myself and Jonelle Sidlearla Godfrey, RN at the patient's bedside. No heart or lung sounds auscultated. Family at bedside. [facility]

## 2014-04-20 NOTE — Progress Notes (Signed)
250 mg/250 ml bag of morphine wasted in sink witnessed by myself, Everette RankJennifer Kalden Wanke, RN and Jonelle Sidlearla Godfrey, RN

## 2014-04-20 DEATH — deceased

## 2016-07-20 IMAGING — CR DG CHEST 1V PORT
1 series · 1 of 1 positions shown · non-contrast
Comparison: None.

CLINICAL DATA: Intubation, trauma

EXAM:
PORTABLE CHEST - 1 VIEW

[portable]
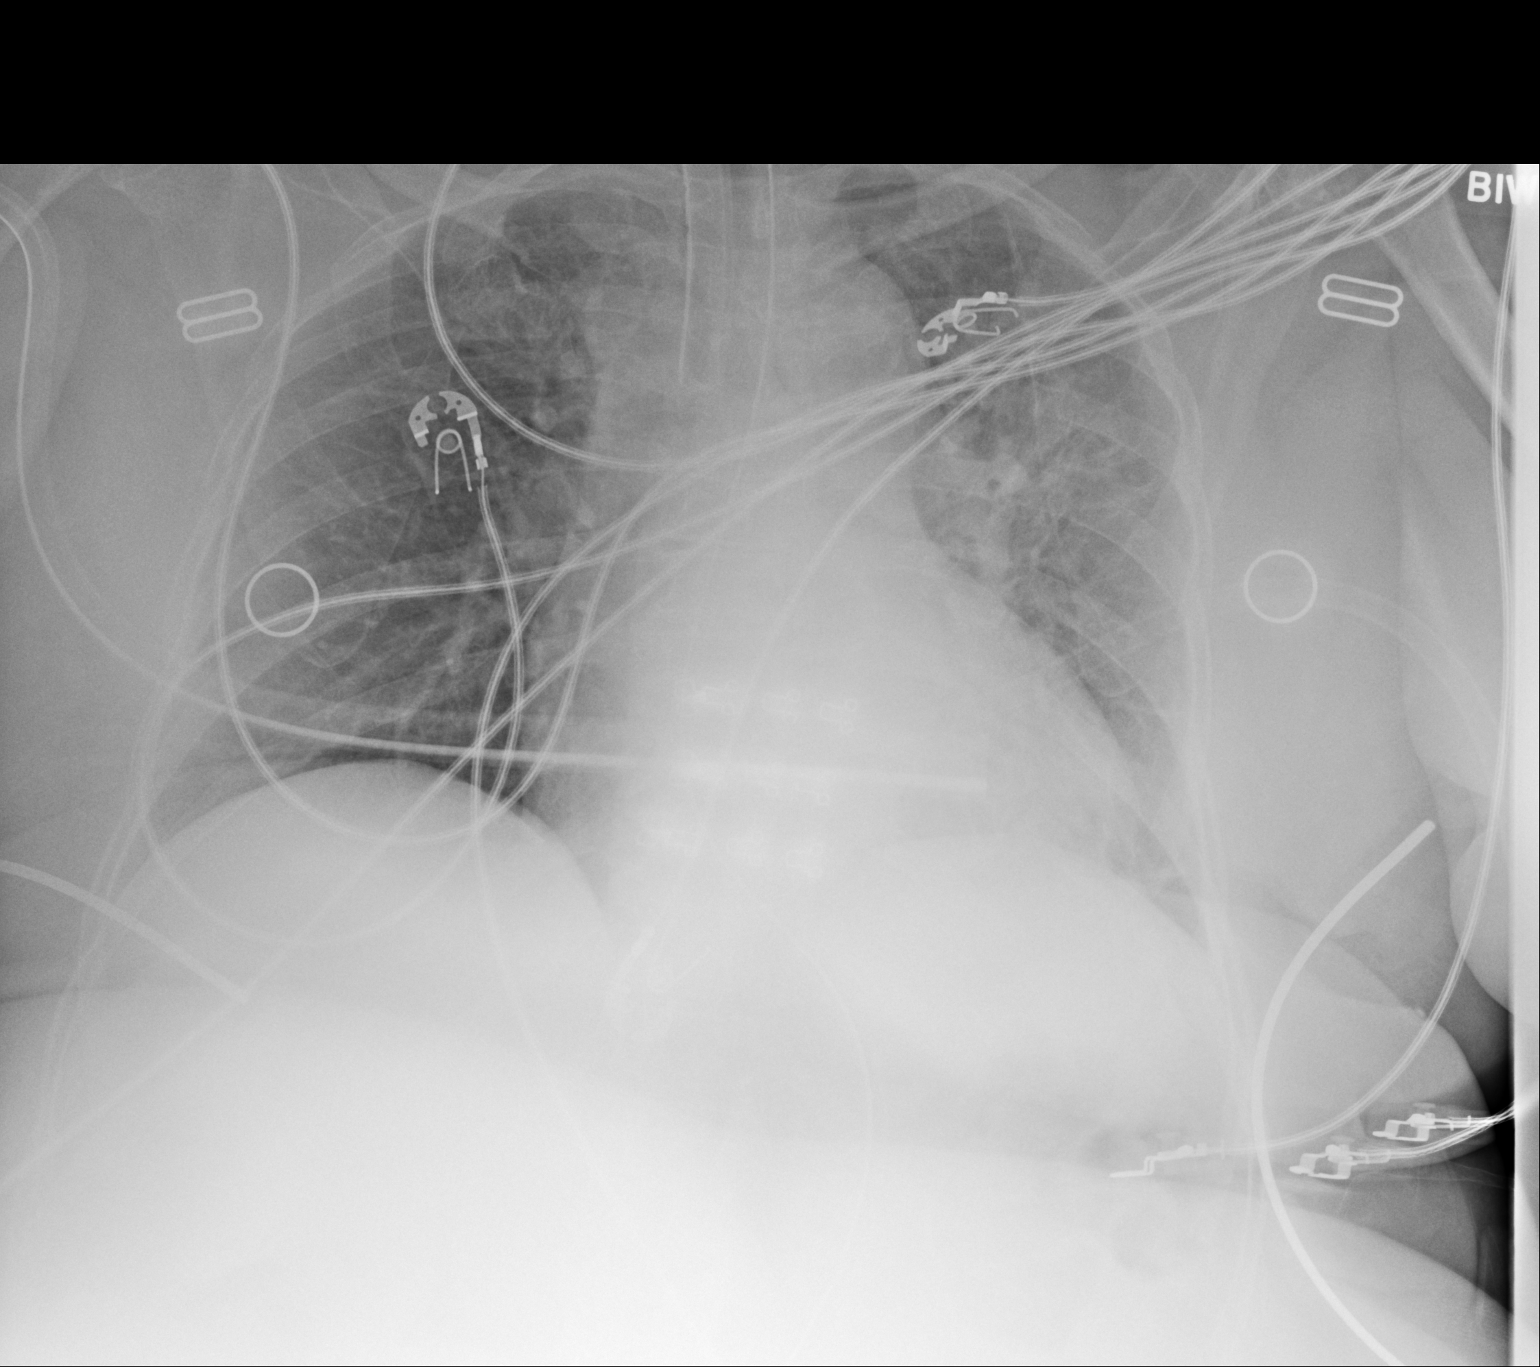

[1 of 1 positions shown; findings below may reference images not displayed]

FINDINGS: Endotracheal tube 2.4 cm above the carina. NG tube enters the
stomach with the tip not visualized. Mild cardiomegaly with central
vascular congestion. Minor bibasilar and streaky left midlung
atelectasis. No focal pneumonia, collapse or consolidation. No
effusion or pneumothorax. Atherosclerosis of the aorta. Degenerative
changes of the shoulders.
IMPRESSION: Cardiomegaly with vascular congestion

Bilateral atelectasis.

Support apparatus as above.

## 2016-07-20 IMAGING — CT CT CERVICAL SPINE W/O CM
4 of 5 series · 15 of 33 positions shown, 17 images · non-contrast
Comparison: None.

CLINICAL DATA: Found down.

EXAM:
CT HEAD WITHOUT CONTRAST
CT CERVICAL SPINE WITHOUT CONTRAST
TECHNIQUE: Multidetector CT imaging of the head and cervical spine was
performed following the standard protocol without intravenous
contrast. Multiplanar CT image reconstructions of the cervical spine
were also generated.

[Series 5: c_spine 2.0 i40s 3 · axial · 0.28mm/px · z∈[-1,+91]mm · 4 of 78 slices shown, 5 images]
[im 16/78  soft-tissue]
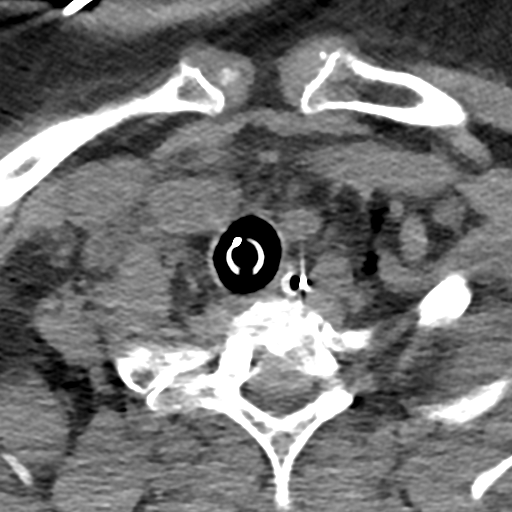
[im 16/78  bone]
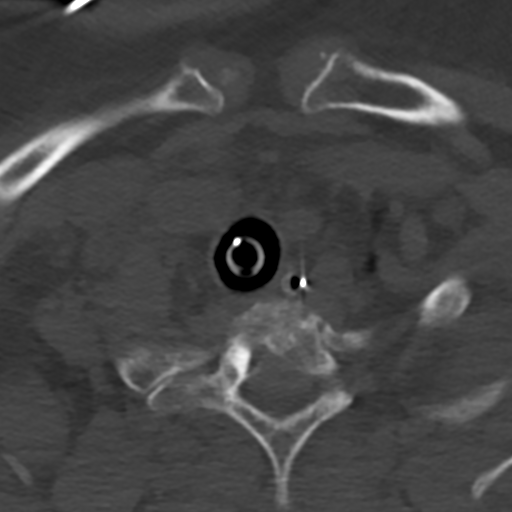
[im 31/78  bone]
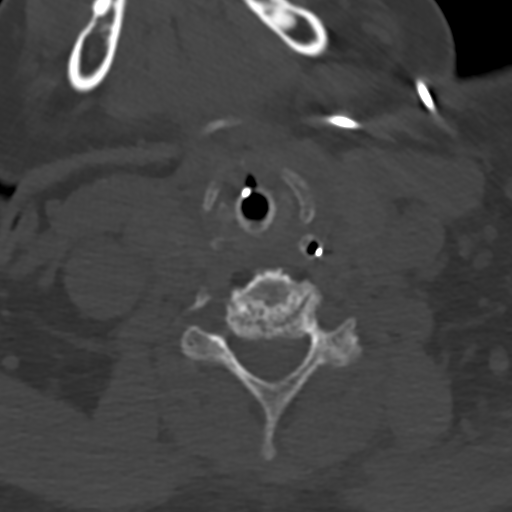
[im 47/78  bone]
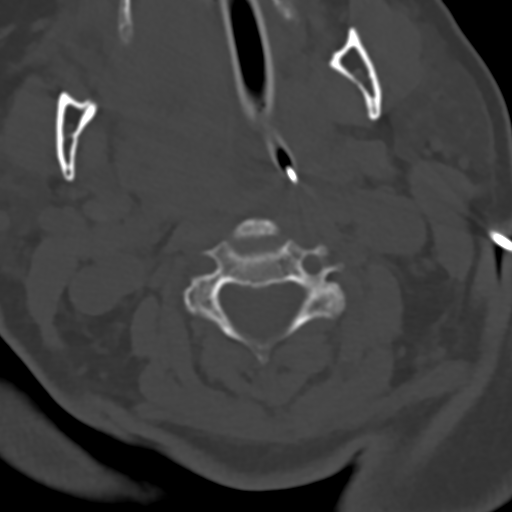
[im 62/78  bone]
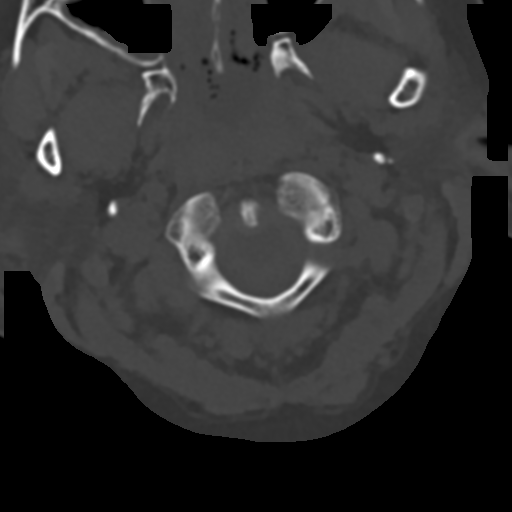

[Series 7: coronals · coronal · 0.33mm/px · 3 of 49 slices shown]
[im 10/49  bone]
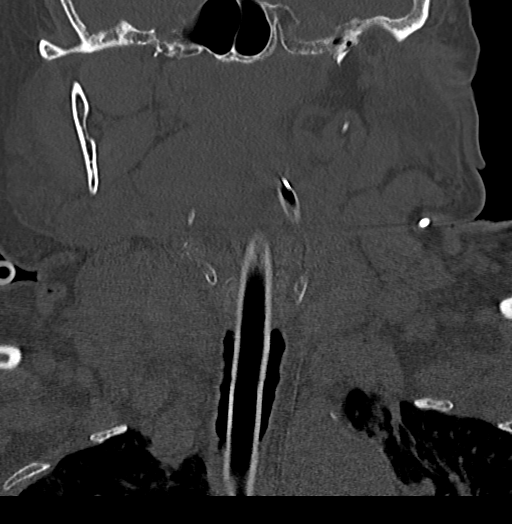
[im 20/49  bone]
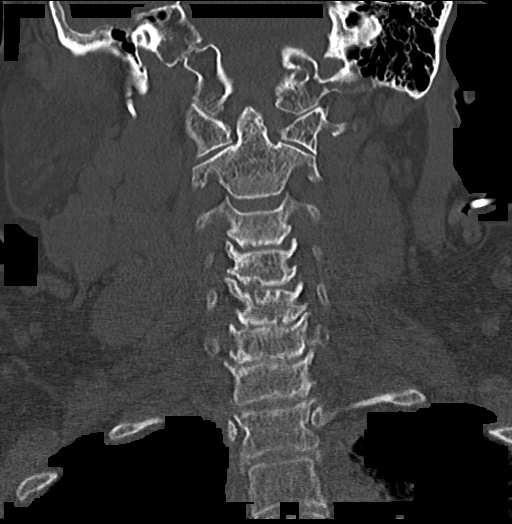
[im 29/49  bone]
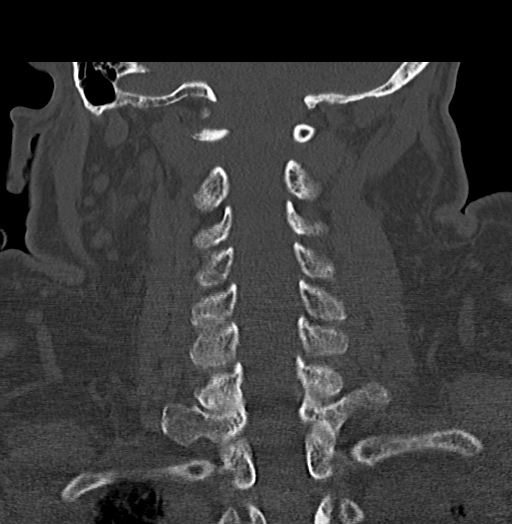

[Series 8: sagittals · sagittal · 0.30mm/px · 5 of 49 slices shown, 6 images]
[im 17/49  bone]
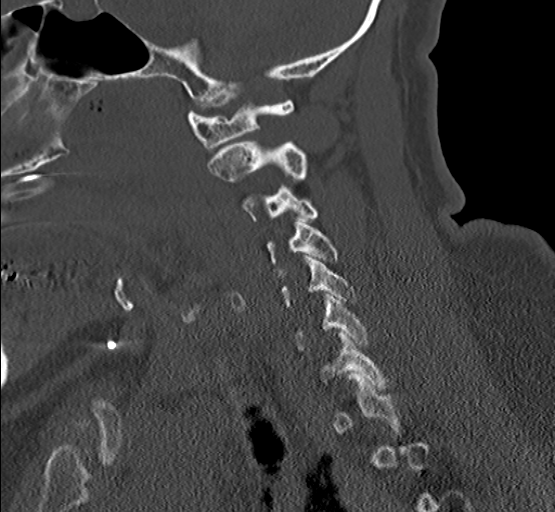
[im 21/49  bone]
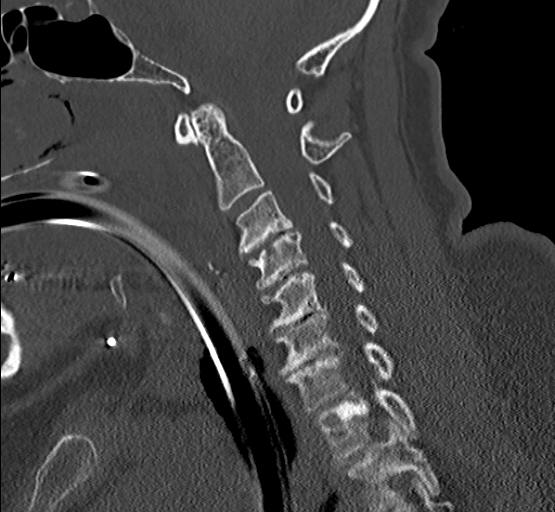
[im 25/49  soft-tissue]
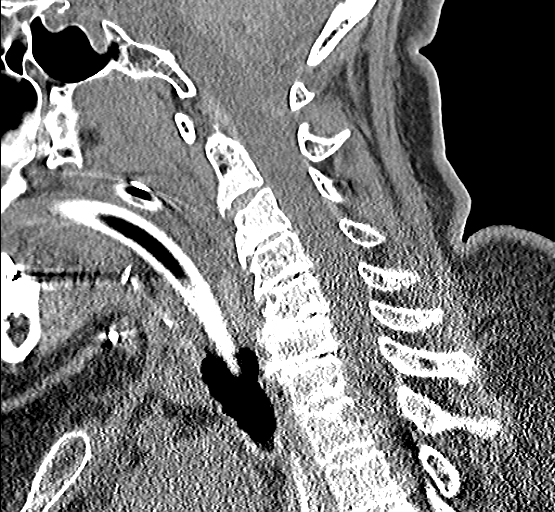
[im 25/49  bone]
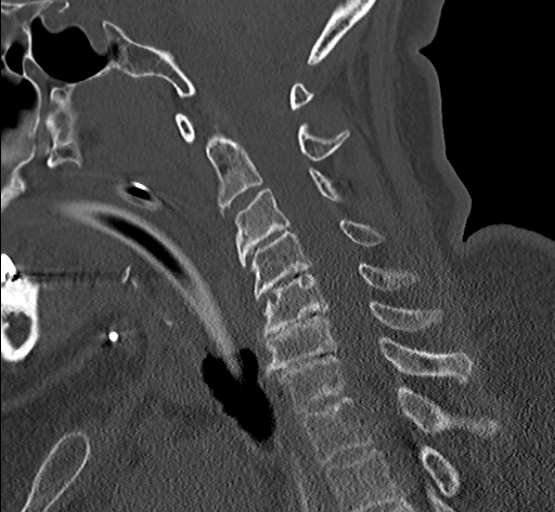
[im 29/49  bone]
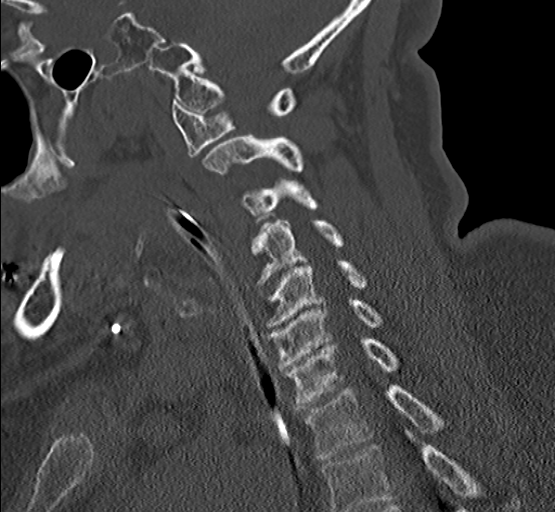
[im 33/49  bone]
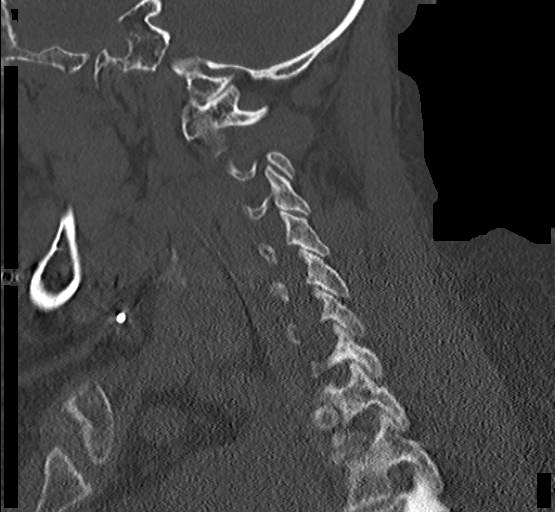

[Series 9: orthogonals · axial · 0.32mm/px · z∈[-34,+28]mm · 3 of 86 slices shown]
[im 18/86  bone]
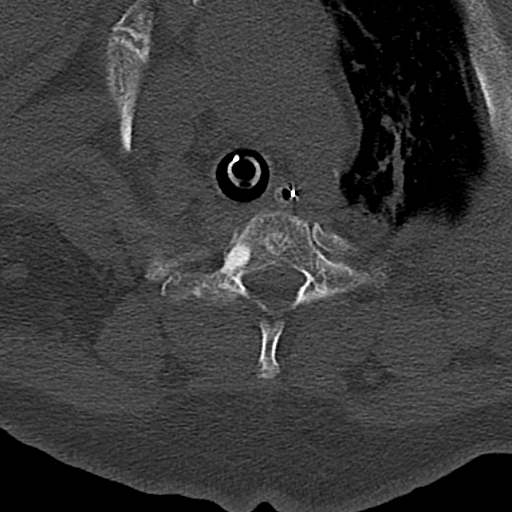
[im 35/86  bone]
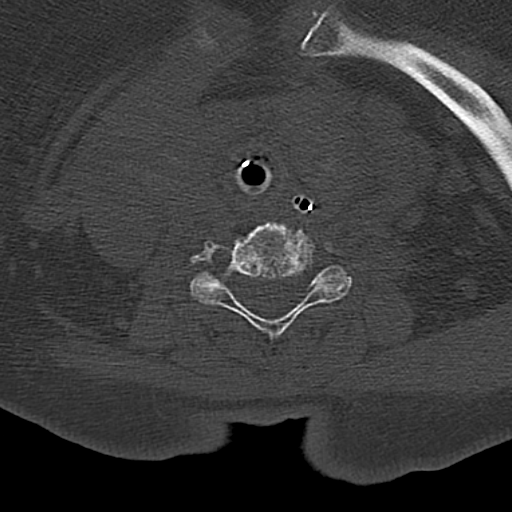
[im 52/86  bone]
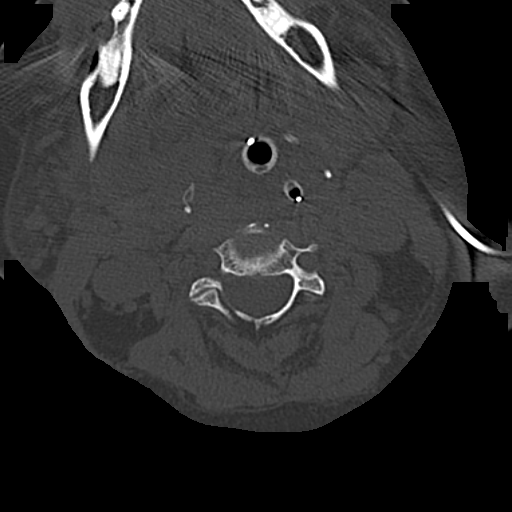

[15 of 33 positions shown; findings below may reference images not displayed]

FINDINGS: CT HEAD FINDINGS

There is a large amount of intraventricular hemorrhage which appears
primarily centered at the level of the fourth ventricle which
appears markedly expanded (image 11, series 2). There is upstream
hydrocephalus with blood seen within the third and bilateral lateral
ventricles. Subarachnoid blood is seen about the imaged superior
aspect of the cervical spine (image 4, series 2).

No definite evidence of subdural hemorrhage. No definite large
territory infarct. No definite intraparenchymal or extra-axial mass.
No midline shift. Intracranial atherosclerosis.

Mucosal thickening within the bilateral posterior ethmoidal air
cells. Small air-fluid level within the right maxillary sinus.
Remaining paranasal sinuses mastoid air cells are normally aerated.

Regional soft tissues appear normal. No displaced calvarial
fracture.

CT CERVICAL SPINE FINDINGS

C1 to the inferior endplate of T2 is imaged.

There is mild straightening and slight reversal of the expected
cervical lordosis. No anterolisthesis or retrolisthesis. The
bilateral facets are normally aligned. The dens is normally
positioned between the lateral masses of C1.

No fracture or static subluxation of the cervical spine. Cervical
vertebral body heights and prevertebral soft tissues are normal.

The patient is intubated.  An orogastric tube is also identified.

Moderate to severe multilevel cervical spine DDD, worse at C5-C6 and
to a lesser extent, C3-C4, C4-C5 and C6-C7 with disc space height
loss, endplate irregularity and sclerosis. A bone island is
incidentally noted within the right C7 pedicle.

No bulky cervical lymphadenopathy on this noncontrast examination.

Limited visualization of lung apices demonstrates biapical pleural
parenchymal thickening and suspected small bilateral effusions, left
greater than right.
IMPRESSION: 1. Large amount of intra ventricular blood which appears centered
within the fourth ventricle, the etiology of which is not depicted
on this examination.
2. Intraventricular blood is seen extending through the third
ventricle to the bilateral lateral ventricles where mild to moderate
upstream hydrocephalus is identified.
3. A small amount of subarachnoid blood surrounds the image superior
aspect of the cervical spine.
4. No fracture or static subluxation of the cervical spine.
5. Moderate to severe multilevel cervical spine DDD, worse at C5-C6.
Critical Value/emergent results were called by telephone at the time
of interpretation on 04/10/2014 at [DATE] to Dr. THIMIO OPTIMAL ,
who verbally acknowledged these results.
